# Patient Record
Sex: Male | Born: 1985 | Race: Black or African American | Hispanic: No | Marital: Married | Smoking: Never smoker
Health system: Southern US, Community
[De-identification: ages and names within clinical notes are randomized; demographics above are authoritative.]

## PROBLEM LIST (undated history)

## (undated) DIAGNOSIS — K219 Gastro-esophageal reflux disease without esophagitis: Secondary | ICD-10-CM

## (undated) DIAGNOSIS — C9590 Leukemia, unspecified not having achieved remission: Secondary | ICD-10-CM

## (undated) HISTORY — PX: CHOLECYSTECTOMY: SHX55

## (undated) HISTORY — PX: WISDOM TOOTH EXTRACTION: SHX21

---

## 2014-03-23 ENCOUNTER — Other Ambulatory Visit: Payer: Self-pay | Admitting: Family Medicine

## 2014-03-23 DIAGNOSIS — R52 Pain, unspecified: Secondary | ICD-10-CM

## 2014-03-23 DIAGNOSIS — R609 Edema, unspecified: Secondary | ICD-10-CM

## 2014-03-27 ENCOUNTER — Encounter (INDEPENDENT_AMBULATORY_CARE_PROVIDER_SITE_OTHER): Payer: Self-pay

## 2014-03-27 ENCOUNTER — Ambulatory Visit
Admission: RE | Admit: 2014-03-27 | Discharge: 2014-03-27 | Disposition: A | Discharge status: Home / Self Care | Payer: 59 | Source: Ambulatory Visit | Attending: Family Medicine | Admitting: Family Medicine

## 2014-03-27 DIAGNOSIS — R52 Pain, unspecified: Secondary | ICD-10-CM

## 2014-03-27 DIAGNOSIS — R609 Edema, unspecified: Secondary | ICD-10-CM

## 2017-05-19 ENCOUNTER — Emergency Department (HOSPITAL_COMMUNITY): Payer: Managed Care, Other (non HMO)

## 2017-05-19 ENCOUNTER — Encounter (HOSPITAL_COMMUNITY): Payer: Self-pay | Admitting: *Deleted

## 2017-05-19 ENCOUNTER — Emergency Department (HOSPITAL_COMMUNITY)
Admission: EM | Admit: 2017-05-19 | Discharge: 2017-05-20 | Disposition: A | Discharge status: Home / Self Care | Payer: Managed Care, Other (non HMO) | Attending: Emergency Medicine | Admitting: Emergency Medicine

## 2017-05-19 DIAGNOSIS — R002 Palpitations: Secondary | ICD-10-CM

## 2017-05-19 DIAGNOSIS — I493 Ventricular premature depolarization: Secondary | ICD-10-CM

## 2017-05-19 DIAGNOSIS — R072 Precordial pain: Secondary | ICD-10-CM

## 2017-05-19 DIAGNOSIS — R008 Other abnormalities of heart beat: Secondary | ICD-10-CM | POA: Diagnosis not present

## 2017-05-19 LAB — MAGNESIUM: MAGNESIUM: 2.2 mg/dL (ref 1.7–2.4)

## 2017-05-19 LAB — BASIC METABOLIC PANEL
ANION GAP: 10 (ref 5–15)
BUN: 9 mg/dL (ref 6–20)
CO2: 24 mmol/L (ref 22–32)
Calcium: 9.6 mg/dL (ref 8.9–10.3)
Chloride: 101 mmol/L (ref 101–111)
Creatinine, Ser: 1.2 mg/dL (ref 0.61–1.24)
Glucose, Bld: 90 mg/dL (ref 65–99)
POTASSIUM: 3.4 mmol/L — AB (ref 3.5–5.1)
SODIUM: 135 mmol/L (ref 135–145)

## 2017-05-19 LAB — CBC
HEMATOCRIT: 45.2 % (ref 39.0–52.0)
HEMOGLOBIN: 15.8 g/dL (ref 13.0–17.0)
MCH: 29.1 pg (ref 26.0–34.0)
MCHC: 35 g/dL (ref 30.0–36.0)
MCV: 83.2 fL (ref 78.0–100.0)
Platelets: 329 10*3/uL (ref 150–400)
RBC: 5.43 MIL/uL (ref 4.22–5.81)
RDW: 12.7 % (ref 11.5–15.5)
WBC: 8.1 10*3/uL (ref 4.0–10.5)

## 2017-05-19 LAB — I-STAT TROPONIN, ED
TROPONIN I, POC: 0 ng/mL (ref 0.00–0.08)
Troponin i, poc: 0 ng/mL (ref 0.00–0.08)

## 2017-05-19 MED ORDER — SODIUM CHLORIDE 0.9 % IV BOLUS (SEPSIS)
1000.0000 mL | Freq: Once | INTRAVENOUS | Status: AC
  Administered 2017-05-19: 1000 mL via INTRAVENOUS

## 2017-05-19 NOTE — ED Notes (Signed)
Occasional PVC noted on monitor. Pt denies pain.

## 2017-05-19 NOTE — ED Notes (Signed)
Lab to add on a mag level to blood they have.

## 2017-05-19 NOTE — ED Provider Notes (Signed)
[facility] EMERGENCY DEPARTMENT Provider Note   CSN: [id number] Arrival date & time: 05/19/17  1755     History   Chief Complaint Chief Complaint  Patient presents with  . Chest Pain  . Palpitations    HPI Charles Huffman is a 31 y.o. male.  The history is provided by the patient, the spouse and medical records.  Palpitations   This is a recurrent problem. The current episode started 12 to 24 hours ago. The problem occurs constantly. The problem has not changed since onset.On average, each episode lasts 1 day. Associated symptoms include chest pain and irregular heartbeat. Pertinent negatives include no diaphoresis, no fever, no malaise/fatigue, no numbness, no chest pressure, no exertional chest pressure, no syncope, no abdominal pain, no nausea, no vomiting, no headaches, no back pain, no lower extremity edema, no dizziness, no weakness, no cough, no shortness of breath and no sputum production. He has tried nothing for the symptoms. The treatment provided no relief. There are no known risk factors. His past medical history does not include anemia or heart disease.    History reviewed. No pertinent past medical history.  There are no active problems to display for this patient.   History reviewed. No pertinent surgical history.     Home Medications    Prior to Admission medications   Not on File    Family History History reviewed. No pertinent family history.  Social History Social History  Substance Use Topics  . Smoking status: Never Smoker  . Smokeless tobacco: Not on file  . Alcohol use No     Allergies   Vancomycin   Review of Systems Review of Systems  Constitutional: Negative for chills, diaphoresis, fatigue, fever and malaise/fatigue.  HENT: Negative for congestion and rhinorrhea.   Eyes: Negative for visual disturbance.  Respiratory: Negative for cough, sputum production, chest tightness, shortness of breath, wheezing and  stridor.   Cardiovascular: Positive for chest pain and palpitations. Negative for leg swelling and syncope.  Gastrointestinal: Negative for abdominal pain, constipation, diarrhea, nausea and vomiting.  Genitourinary: Negative for flank pain, frequency and urgency.  Musculoskeletal: Negative for back pain, neck pain and neck stiffness.  Neurological: Positive for light-headedness. Negative for dizziness, seizures, syncope, weakness, numbness and headaches.  Psychiatric/Behavioral: Negative for agitation.  All other systems reviewed and are negative.    Physical Exam Updated Vital Signs BP 138/85 (BP Location: Right Arm)   Pulse 79   Temp 98.6 F (37 C) (Oral)   Resp 14   Ht 5\' 11"  (1.803 m)   Wt 106.6 kg (235 lb)   SpO2 100%   BMI 32.78 kg/m   Physical Exam  Constitutional: He is oriented to person, place, and time. He appears well-developed and well-nourished. No distress.  HENT:  Head: Normocephalic and atraumatic.  Nose: Nose normal.  Mouth/Throat: Oropharynx is clear and moist. No oropharyngeal exudate.  Eyes: Pupils are equal, round, and reactive to light. Conjunctivae are normal.  Neck: Normal range of motion. Neck supple.  Cardiovascular: Normal rate, regular rhythm and intact distal pulses.  Frequent extrasystoles are present.  No murmur heard. Intermittent episodes of trigeminy with PVCs.  Pulmonary/Chest: Effort normal and breath sounds normal. No respiratory distress. He has no wheezes. He exhibits no tenderness.  Abdominal: Soft. There is no tenderness.  Musculoskeletal: He exhibits no edema or tenderness.  Neurological: He is alert and oriented to person, place, and time. He displays normal reflexes. No sensory deficit.  Skin: Skin is warm  and dry. He is not diaphoretic. No erythema. No pallor.  Psychiatric: He has a normal mood and affect.  Nursing note and vitals reviewed.    ED Treatments / Results  Labs (all labs ordered are listed, but only abnormal  results are displayed) Labs Reviewed  BASIC METABOLIC PANEL - Abnormal; Notable for the following:       Result Value   Potassium 3.4 (*)    All other components within normal limits  CBC  MAGNESIUM  I-STAT TROPONIN, ED  I-STAT TROPONIN, ED    EKG  EKG Interpretation  Date/Time:  Saturday May 19 2017 18:04:16 EDT Ventricular Rate:  84 PR Interval:  156 QRS Duration: 82 QT Interval:  346 QTC Calculation: 408 R Axis:   -23 Text Interpretation:  Sinus rhythm with occasional Premature ventricular complexes Low voltage QRS Borderline ECG No prior ECG for comparison. PVC present.  No STEMI Confirmed by Antony Blackbird [phone]) on 05/19/2017 8:32:21 PM       Radiology Dg Chest 2 View  Result Date: 05/19/2017 CLINICAL DATA:  Chest discomfort. EXAM: CHEST  2 VIEW COMPARISON:  None. FINDINGS: The heart size and mediastinal contours are within normal limits. Both lungs are clear. The visualized skeletal structures are unremarkable. IMPRESSION: No active cardiopulmonary disease. Electronically Signed   By: Dorise Bullion III M.D   On: 05/19/2017 19:38    Procedures Procedures (including critical care time)  Medications Ordered in ED Medications  sodium chloride 0.9 % bolus 1,000 mL (0 mLs Intravenous Stopped 05/19/17 2348)     Initial Impression / Assessment and Plan / ED Course  I have reviewed the triage vital signs and the nursing notes.  Pertinent labs & imaging results that were available during my care of the patient were reviewed by me and considered in my medical decision making (see chart for details).     Charles Huffman is a 31 y.o. male with no significant past medical history who presents with palpitations and chest discomfort.  Patient said that he has had these symptoms in the past.  He says that they usually last for approximately 1 day and then go away.  He says that he has had no other preceding symptoms.  He reports some mild chest pressure that does not  radiate.  He says he feels palpitations that correlate to PVCs on telemetry in triage.  Patient denies fevers, chills, shortness of breath, nausea, vomiting, diaphoresis.  He denies alcohol or new caffeine intake.  He denies drugs.  On exam, lungs are clear.  Chest is nontender.  No lower extremity edema.  No focal neurologic deficits.  Abdomen nontender.  Symmetric pulses.  EKG showed PVCs.  No STEMI.  Patient had workup to look for electrolyte abnormality or other causes of chest pain/PVCs.  Magnesium normal.  Troponin negative x2.  Potassium slightly decreased.  Chest x-ray reassuring.  Patient given fluids.  As patient's workup shows no evidence of cardiac injury and he has demonstrated stability with only PVCs seen on EKG, patient was felt stable for discharge home.  Patient was instructed to follow-up with cardiology and will likely need cardiac monitor placed.  Patient encouraged to stay hydrated and maintain normal diet.  Patient and spouse understood return precautions for any new or worsening symptoms.  Patient had no other questions or concerns and was discharged in good condition.     Final Clinical Impressions(s) / ED Diagnoses   Final diagnoses:  Palpitations  PVC's (premature ventricular contractions)  Trigeminy  Precordial pain    New Prescriptions Discharge Medication List as of 05/20/2017 12:10 AM      Clinical Impression: 1. Palpitations   2. PVC's (premature ventricular contractions)   3. Trigeminy   4. Precordial pain     Disposition: Discharge  Condition: Good  I have discussed the results, Dx and Tx plan with the pt(& family if present). He/she/they expressed understanding and agree(s) with the plan. Discharge instructions discussed at great length. Strict return precautions discussed and pt &/or family have verbalized understanding of the instructions. No further questions at time of discharge.    Discharge Medication List as of 05/20/2017 12:10  AM      Follow Up: [place] [id number] [phone] Schedule an appointment as soon as possible for a visit    [facility] [address] [id number] [place] [phone]  If symptoms worsen     Jasmin Trumbull, Gwenyth Allegra, MD 05/20/17 [phone]

## 2017-05-19 NOTE — ED Notes (Signed)
Patient denies pain and is resting comfortably.  

## 2017-05-19 NOTE — ED Notes (Signed)
Pt awaiting cardiologist eval. Denies cp or sob. VSS. Watching TV. Family at bedside.

## 2017-05-19 NOTE — ED Triage Notes (Signed)
Pt reports waking up today with chest discomfort, "feels like something is stuck in his throat." also having palpitations and intermittent pain. ekg done on arrival. No acute distress is noted. Skin w/d. Denies sob or recent swelling.

## 2017-05-20 NOTE — Discharge Instructions (Signed)
Your workup today showed evidence of extra heartbeats called PVCs.  We did not see evidence of heart injury or infection.  Based on your reassuring workup, we feel you are safe for discharge home.  Please follow-up with a cardiologist for further evaluation.  If any symptoms change or worsen, please return to the nearest emergency department.

## 2020-12-15 ENCOUNTER — Ambulatory Visit: Payer: Self-pay | Admitting: Surgery

## 2020-12-15 NOTE — H&P (Signed)
  Charles Huffman DOB: 07/21/1986 Married / Language: English / Race: Black or African American Male   History of Present Illness Patient words: This is a very pleasant and otherwise healthy 35-year-old man with about a 3 year history of an intermittently draining cyst just to the right of the natal cleft.  This does not necessarily cause pain, but spontaneously swells and drains.  Has not required any previous procedures for this.   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     Past Surgical History Gallbladder Surgery - Laparoscopic    Diagnostic Studies History Colonoscopy  never   Allergies Vancomycin HCl *ANTI-INFECTIVE AGENTS - MISC.*    Medication History Crestor  (5MG Tablet, Oral) Active.   Social History Caffeine use  Carbonated beverages. No alcohol use  No drug use  Tobacco use  Never smoker.   Family History Hypertension  Brother, Father. Prostate Cancer  Father.   Other Problems Cancer  Gastroesophageal Reflux Disease  Hypercholesterolemia          Review of Systems General Not Present- Appetite Loss, Chills, Fatigue, Fever, Night Sweats, Weight Gain and Weight Loss. HEENT Present- Wears glasses/contact lenses. Not Present- Earache, Hearing Loss, Hoarseness, Nose Bleed, Oral Ulcers, Ringing in the Ears, Seasonal Allergies, Sinus Pain, Sore Throat, Visual Disturbances and Yellow Eyes. Respiratory Not Present- Bloody sputum, Chronic Cough, Difficulty Breathing, Snoring and Wheezing. Breast Not Present- Breast Mass, Breast Pain, Nipple Discharge and Skin Changes. Cardiovascular Not Present- Chest Pain, Difficulty Breathing Lying Down, Leg Cramps, Palpitations, Rapid Heart Rate, Shortness of Breath and Swelling of Extremities. Gastrointestinal Not Present- Abdominal Pain, Bloating, Bloody Stool, Change in Bowel Habits, Chronic diarrhea, Constipation, Difficulty Swallowing, Excessive gas, Gets full quickly at meals, Hemorrhoids, Indigestion, Nausea, Rectal Pain  and Vomiting. Male Genitourinary Not Present- Blood in Urine, Change in Urinary Stream, Frequency, Impotence, Nocturia, Painful Urination, Urgency and Urine Leakage. Musculoskeletal Not Present- Back Pain, Joint Pain, Joint Stiffness, Muscle Pain, Muscle Weakness and Swelling of Extremities. Neurological Not Present- Decreased Memory, Fainting, Headaches, Numbness, Seizures, Tingling, Tremor, Trouble walking and Weakness. Psychiatric Not Present- Anxiety, Bipolar, Change in Sleep Pattern, Depression, Fearful and Frequent crying. Endocrine Not Present- Cold Intolerance, Excessive Hunger, Hair Changes, Heat Intolerance, Hot flashes and New Diabetes. Hematology Not Present- Blood Thinners, Easy Bruising, Excessive bleeding, Gland problems, HIV and Persistent Infections. All other systems negative   Vitals Weight: 251.5 lb   Height: 72 in  Body Surface Area: 2.35 m   Body Mass Index: 34.11 kg/m   Temp.: 97.7 F    Pulse: 111 (Regular)    P.OX: 94% (Room air) BP: 150/100(Sitting, Left Arm, Standard)     Physical Exam Alert, well-appearing Unlabored respirations At the upper aspect of the natal cleft to the right of midline there is a keloid with some mild underlying induration. No erythema, warmth, tenderness or fluctuance at this time.     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 Discussed options for treatment including ongoing observation, hair removal, excision. He wishes to proceed with the latter. We discussed the procedure and risks of bleeding, infection, pain, scarring, wound healing problems, recurrence of lesion, and ongoing hygiene measures to prevent recurrence postoperatively. Questions were answered. Schedule at his convenience 

## 2021-01-12 NOTE — Patient Instructions (Signed)
DUE TO COVID-19 ONLY ONE VISITOR IS ALLOWED TO COME WITH YOU AND STAY IN THE WAITING ROOM ONLY DURING PRE OP AND PROCEDURE DAY OF SURGERY. THE 1 VISITOR  MAY VISIT WITH YOU AFTER SURGERY IN YOUR PRIVATE ROOM DURING VISITING HOURS ONLY!               Charles Huffman   Your procedure is scheduled on: 01/19/21   Report to [facility] Main  Entrance   Report to admitting at : 10:00 AM     Call this number if you have problems the morning of surgery [phone]    Remember: Do not eat solid food :After Midnight. Clear liquids until: 9:00 am.  CLEAR LIQUID DIET  Foods Allowed                                                                     Foods Excluded  Coffee and tea, regular and decaf                             liquids that you cannot  Plain Jell-O any favor except red or purple                                           see through such as: Fruit ices (not with fruit pulp)                                     milk, soups, orange juice  Iced Popsicles                                    All solid food Carbonated beverages, regular and diet                                    Cranberry, grape and apple juices Sports drinks like Gatorade Lightly seasoned clear broth or consume(fat free) Sugar, honey syrup  Sample Menu Breakfast                                Lunch                                     Supper Cranberry juice                    Beef broth                            Chicken broth Jell-O                                     Grape  juice                           Apple juice Coffee or tea                        Jell-O                                      Popsicle                                                Coffee or tea                        Coffee or tea  _____________________________________________________________________  BRUSH YOUR TEETH MORNING OF SURGERY AND RINSE YOUR MOUTH OUT, NO CHEWING GUM CANDY OR MINTS.                               You may not  have any metal on your body including hair pins and              piercings  Do not wear jewelry,lotions, powders or perfumes, deodorant             Men may shave face and neck.   Do not bring valuables to the hospital. [place].  Contacts, dentures or bridgework may not be worn into surgery.  Leave suitcase in the car. After surgery it may be brought to your room.     Patients discharged the day of surgery will not be allowed to drive home. IF YOU ARE HAVING SURGERY AND GOING HOME THE SAME DAY, YOU MUST HAVE AN ADULT TO DRIVE YOU HOME AND BE WITH YOU FOR 24 HOURS. YOU MAY GO HOME BY TAXI OR UBER OR ORTHERWISE, BUT AN ADULT MUST ACCOMPANY YOU HOME AND STAY WITH YOU FOR 24 HOURS.  Name and phone number of your driver:  Special Instructions: N/A              Please read over the following fact sheets you were given: _____________________________________________________________________           [facility] - Preparing for Surgery Before surgery, you can play an important role.  Because skin is not sterile, your skin needs to be as free of germs as possible.  You can reduce the number of germs on your skin by washing with CHG (chlorahexidine gluconate) soap before surgery.  CHG is an antiseptic cleaner which kills germs and bonds with the skin to continue killing germs even after washing. Please DO NOT use if you have an allergy to CHG or antibacterial soaps.  If your skin becomes reddened/irritated stop using the CHG and inform your nurse when you arrive at Short Stay. Do not shave (including legs and underarms) for at least 48 hours prior to the first CHG shower.  You may shave your face/neck. Please follow these instructions carefully:  1.  Shower with CHG Soap the night before surgery and the  morning of Surgery.  2.  If you choose to wash your hair, wash your hair first as usual with your  normal  shampoo.  3.  After you shampoo, rinse your  hair and body thoroughly to remove the  shampoo.                           4.  Use CHG as you would any other liquid soap.  You can apply chg directly  to the skin and wash                       Gently with a scrungie or clean washcloth.  5.  Apply the CHG Soap to your body ONLY FROM THE NECK DOWN.   Do not use on face/ open                           Wound or open sores. Avoid contact with eyes, ears mouth and genitals (private parts).                       Wash face,  Genitals (private parts) with your normal soap.             6.  Wash thoroughly, paying special attention to the area where your surgery  will be performed.  7.  Thoroughly rinse your body with warm water from the neck down.  8.  DO NOT shower/wash with your normal soap after using and rinsing off  the CHG Soap.                9.  Pat yourself dry with a clean towel.            10.  Wear clean pajamas.            11.  Place clean sheets on your bed the night of your first shower and do not  sleep with pets. Day of Surgery : Do not apply any lotions/deodorants the morning of surgery.  Please wear clean clothes to the hospital/surgery center.  FAILURE TO FOLLOW THESE INSTRUCTIONS MAY RESULT IN THE CANCELLATION OF YOUR SURGERY PATIENT SIGNATURE_________________________________  NURSE SIGNATURE__________________________________  ________________________________________________________________________

## 2021-01-13 ENCOUNTER — Encounter (HOSPITAL_COMMUNITY)
Admission: RE | Admit: 2021-01-13 | Discharge: 2021-01-13 | Disposition: A | Discharge status: Home / Self Care | Payer: Self-pay | Source: Ambulatory Visit | Attending: Surgery | Admitting: Surgery

## 2021-01-13 ENCOUNTER — Encounter (HOSPITAL_COMMUNITY): Payer: Self-pay

## 2021-01-13 ENCOUNTER — Other Ambulatory Visit: Payer: Self-pay

## 2021-01-13 DIAGNOSIS — Z01812 Encounter for preprocedural laboratory examination: Secondary | ICD-10-CM | POA: Insufficient documentation

## 2021-01-13 HISTORY — DX: Leukemia, unspecified not having achieved remission: C95.90

## 2021-01-13 HISTORY — DX: Gastro-esophageal reflux disease without esophagitis: K21.9

## 2021-01-13 LAB — CBC
HCT: 43.3 % (ref 39.0–52.0)
Hemoglobin: 14.5 g/dL (ref 13.0–17.0)
MCH: 27.7 pg (ref 26.0–34.0)
MCHC: 33.5 g/dL (ref 30.0–36.0)
MCV: 82.6 fL (ref 80.0–100.0)
Platelets: 346 10*3/uL (ref 150–400)
RBC: 5.24 MIL/uL (ref 4.22–5.81)
RDW: 13 % (ref 11.5–15.5)
WBC: 8.5 10*3/uL (ref 4.0–10.5)
nRBC: 0 % (ref 0.0–0.2)

## 2021-01-13 NOTE — Progress Notes (Signed)
COVID Vaccine Completed: Yes x3 per pt Date COVID Vaccine completed:  Has received booster: x1 COVID vaccine manufacturer: Moderna    Date of COVID positive in last 90 days: N/A  PCP - Rachell Cipro Cardiologist - N/A  Chest x-ray - N/A EKG - N/A Stress Test - 2013 per pt ECHO - when pt was 9 per pt Cardiac Cath - N/A Pacemaker/ICD device last checked: Spinal Cord Stimulator:  Sleep Study - N/A CPAP -   Fasting Blood Sugar - N/A Checks Blood Sugar _____ times a day  Blood Thinner Instructions: N/A Aspirin Instructions: Last Dose:  Activity level:  Can go up a flight of stairs and perform activities of daily living without stopping and without symptoms of chest pain or shortness of breath. Able to exercise without symptoms.     Anesthesia review:   Patient denies shortness of breath, fever, cough and chest pain at PAT appointment   Patient verbalized understanding of instructions that were given to them at the PAT appointment. Patient was also instructed that they will need to review over the PAT instructions again at home before surgery.

## 2021-01-17 ENCOUNTER — Other Ambulatory Visit (HOSPITAL_COMMUNITY)
Admission: RE | Admit: 2021-01-17 | Discharge: 2021-01-17 | Disposition: A | Discharge status: Home / Self Care | Payer: No Typology Code available for payment source | Source: Ambulatory Visit | Attending: Surgery | Admitting: Surgery

## 2021-01-17 DIAGNOSIS — Z01812 Encounter for preprocedural laboratory examination: Secondary | ICD-10-CM | POA: Insufficient documentation

## 2021-01-17 DIAGNOSIS — Z20822 Contact with and (suspected) exposure to covid-19: Secondary | ICD-10-CM | POA: Insufficient documentation

## 2021-01-17 LAB — SARS CORONAVIRUS 2 (TAT 6-24 HRS): SARS Coronavirus 2: NEGATIVE

## 2021-01-18 NOTE — Anesthesia Preprocedure Evaluation (Addendum)
Anesthesia Evaluation  Patient identified by MRN, date of birth, ID band Patient awake    Reviewed: Allergy & Precautions, NPO status , Patient's Chart, lab work & pertinent test results  Airway Mallampati: II  TM Distance: >3 FB Neck ROM: Full    Dental no notable dental hx. (+) Teeth Intact, Dental Advisory Given   Pulmonary neg pulmonary ROS,    Pulmonary exam normal breath sounds clear to auscultation       Cardiovascular Exercise Tolerance: Good negative cardio ROS Normal cardiovascular exam Rhythm:Regular Rate:Normal     Neuro/Psych negative neurological ROS     GI/Hepatic Neg liver ROS,   Endo/Other  negative endocrine ROS  Renal/GU negative Renal ROS     Musculoskeletal negative musculoskeletal ROS (+)   Abdominal (+) + obese,   Peds  Hematology   Anesthesia Other Findings   Reproductive/Obstetrics                            Anesthesia Physical Anesthesia Plan  ASA: 2  Anesthesia Plan: MAC   Post-op Pain Management:    Induction:   PONV Risk Score and Plan: 2 and Treatment may vary due to age or medical condition, Ondansetron and Midazolam  Airway Management Planned: Natural Airway  Additional Equipment: None  Intra-op Plan:   Post-operative Plan:   Informed Consent:     Dental advisory given  Plan Discussed with: CRNA  Anesthesia Plan Comments: (Prone MAC)        Anesthesia Quick Evaluation

## 2021-01-19 ENCOUNTER — Encounter (HOSPITAL_COMMUNITY): Payer: Self-pay | Admitting: Surgery

## 2021-01-19 ENCOUNTER — Ambulatory Visit (HOSPITAL_COMMUNITY): Payer: PRIVATE HEALTH INSURANCE | Admitting: Certified Registered Nurse Anesthetist

## 2021-01-19 ENCOUNTER — Other Ambulatory Visit (HOSPITAL_COMMUNITY): Payer: Managed Care, Other (non HMO)

## 2021-01-19 ENCOUNTER — Ambulatory Visit (HOSPITAL_COMMUNITY)
Admission: RE | Admit: 2021-01-19 | Discharge: 2021-01-19 | Disposition: A | Discharge status: Home / Self Care | Payer: PRIVATE HEALTH INSURANCE | Source: Ambulatory Visit | Attending: Surgery | Admitting: Surgery

## 2021-01-19 ENCOUNTER — Encounter (HOSPITAL_COMMUNITY)
Admission: RE | Disposition: A | Discharge status: Home / Self Care | Payer: Self-pay | Source: Ambulatory Visit | Attending: Surgery

## 2021-01-19 DIAGNOSIS — Z79899 Other long term (current) drug therapy: Secondary | ICD-10-CM | POA: Diagnosis not present

## 2021-01-19 DIAGNOSIS — E7800 Pure hypercholesterolemia, unspecified: Secondary | ICD-10-CM | POA: Diagnosis not present

## 2021-01-19 DIAGNOSIS — Z881 Allergy status to other antibiotic agents status: Secondary | ICD-10-CM | POA: Insufficient documentation

## 2021-01-19 DIAGNOSIS — K219 Gastro-esophageal reflux disease without esophagitis: Secondary | ICD-10-CM | POA: Insufficient documentation

## 2021-01-19 DIAGNOSIS — L0591 Pilonidal cyst without abscess: Secondary | ICD-10-CM | POA: Insufficient documentation

## 2021-01-19 DIAGNOSIS — Z8249 Family history of ischemic heart disease and other diseases of the circulatory system: Secondary | ICD-10-CM | POA: Insufficient documentation

## 2021-01-19 HISTORY — PX: PILONIDAL CYST EXCISION: SHX744

## 2021-01-19 SURGERY — EXCISION, PILONIDAL CYST, EXTENSIVE
Anesthesia: Monitor Anesthesia Care

## 2021-01-19 MED ORDER — HYDROGEN PEROXIDE 3 % EX SOLN
CUTANEOUS | Status: DC | PRN
  Administered 2021-01-19: 1

## 2021-01-19 MED ORDER — CHLORHEXIDINE GLUCONATE 4 % EX LIQD: 60.0000 mL | Freq: Once | CUTANEOUS | Status: DC

## 2021-01-19 MED ORDER — DEXAMETHASONE SODIUM PHOSPHATE 10 MG/ML IJ SOLN
INTRAMUSCULAR | Status: AC
  Filled 2021-01-19: qty 1

## 2021-01-19 MED ORDER — ONDANSETRON HCL 4 MG/2ML IJ SOLN: 4.0000 mg | Freq: Once | INTRAMUSCULAR | Status: DC | PRN

## 2021-01-19 MED ORDER — CHLORHEXIDINE GLUCONATE 0.12 % MT SOLN
15.0000 mL | Freq: Once | OROMUCOSAL | Status: AC
  Administered 2021-01-19: 15 mL via OROMUCOSAL

## 2021-01-19 MED ORDER — DEXMEDETOMIDINE (PRECEDEX) IN NS 20 MCG/5ML (4 MCG/ML) IV SYRINGE
PREFILLED_SYRINGE | INTRAVENOUS | Status: AC
  Filled 2021-01-19: qty 5

## 2021-01-19 MED ORDER — MIDAZOLAM HCL 5 MG/5ML IJ SOLN
INTRAMUSCULAR | Status: DC | PRN
  Administered 2021-01-19: 2 mg via INTRAVENOUS

## 2021-01-19 MED ORDER — HYDROMORPHONE HCL 1 MG/ML IJ SOLN: 0.2500 mg | INTRAMUSCULAR | Status: DC | PRN

## 2021-01-19 MED ORDER — HYDROGEN PEROXIDE 3 % EX SOLN
CUTANEOUS | Status: AC
  Filled 2021-01-19: qty 473

## 2021-01-19 MED ORDER — PROPOFOL 500 MG/50ML IV EMUL
INTRAVENOUS | Status: DC | PRN
  Administered 2021-01-19: 100 ug/kg/min via INTRAVENOUS

## 2021-01-19 MED ORDER — OXYCODONE HCL 5 MG/5ML PO SOLN: 5.0000 mg | Freq: Once | ORAL | Status: DC | PRN

## 2021-01-19 MED ORDER — ACETAMINOPHEN 10 MG/ML IV SOLN: 1000.0000 mg | Freq: Once | INTRAVENOUS | Status: DC | PRN

## 2021-01-19 MED ORDER — PROPOFOL 500 MG/50ML IV EMUL
INTRAVENOUS | Status: AC
  Filled 2021-01-19: qty 50

## 2021-01-19 MED ORDER — ROCURONIUM BROMIDE 10 MG/ML (PF) SYRINGE
PREFILLED_SYRINGE | INTRAVENOUS | Status: AC
  Filled 2021-01-19: qty 10

## 2021-01-19 MED ORDER — TRAMADOL HCL 50 MG PO TABS: 50.0000 mg | ORAL_TABLET | Freq: Four times a day (QID) | ORAL | 0 refills | Status: AC | PRN

## 2021-01-19 MED ORDER — CEFAZOLIN SODIUM-DEXTROSE 2-4 GM/100ML-% IV SOLN
2.0000 g | INTRAVENOUS | Status: AC
  Administered 2021-01-19: 2 g via INTRAVENOUS
  Filled 2021-01-19: qty 100

## 2021-01-19 MED ORDER — MIDAZOLAM HCL 2 MG/2ML IJ SOLN
INTRAMUSCULAR | Status: AC
  Filled 2021-01-19: qty 2

## 2021-01-19 MED ORDER — ACETAMINOPHEN 500 MG PO TABS
1000.0000 mg | ORAL_TABLET | ORAL | Status: AC
  Administered 2021-01-19: 1000 mg via ORAL
  Filled 2021-01-19: qty 2

## 2021-01-19 MED ORDER — LIDOCAINE-EPINEPHRINE 1 %-1:100000 IJ SOLN
INTRAMUSCULAR | Status: AC
  Filled 2021-01-19: qty 1

## 2021-01-19 MED ORDER — PROPOFOL 1000 MG/100ML IV EMUL
INTRAVENOUS | Status: AC
  Filled 2021-01-19: qty 100

## 2021-01-19 MED ORDER — FENTANYL CITRATE (PF) 100 MCG/2ML IJ SOLN
INTRAMUSCULAR | Status: DC | PRN
  Administered 2021-01-19: 25 ug via INTRAVENOUS
  Administered 2021-01-19: 50 ug via INTRAVENOUS
  Administered 2021-01-19: 25 ug via INTRAVENOUS

## 2021-01-19 MED ORDER — PHENYLEPHRINE 40 MCG/ML (10ML) SYRINGE FOR IV PUSH (FOR BLOOD PRESSURE SUPPORT)
PREFILLED_SYRINGE | INTRAVENOUS | Status: AC
  Filled 2021-01-19: qty 10

## 2021-01-19 MED ORDER — FENTANYL CITRATE (PF) 100 MCG/2ML IJ SOLN
INTRAMUSCULAR | Status: AC
  Filled 2021-01-19: qty 2

## 2021-01-19 MED ORDER — DEXMEDETOMIDINE (PRECEDEX) IN NS 20 MCG/5ML (4 MCG/ML) IV SYRINGE
PREFILLED_SYRINGE | INTRAVENOUS | Status: DC | PRN
  Administered 2021-01-19: 8 ug via INTRAVENOUS
  Administered 2021-01-19: 4 ug via INTRAVENOUS
  Administered 2021-01-19: 8 ug via INTRAVENOUS

## 2021-01-19 MED ORDER — ORAL CARE MOUTH RINSE: 15.0000 mL | Freq: Once | OROMUCOSAL | Status: AC

## 2021-01-19 MED ORDER — OXYCODONE HCL 5 MG PO TABS: 5.0000 mg | ORAL_TABLET | Freq: Once | ORAL | Status: DC | PRN

## 2021-01-19 MED ORDER — EPHEDRINE 5 MG/ML INJ
INTRAVENOUS | Status: AC
  Filled 2021-01-19: qty 10

## 2021-01-19 MED ORDER — AMISULPRIDE (ANTIEMETIC) 5 MG/2ML IV SOLN: 10.0000 mg | Freq: Once | INTRAVENOUS | Status: DC | PRN

## 2021-01-19 MED ORDER — LIDOCAINE-EPINEPHRINE 1 %-1:100000 IJ SOLN
INTRAMUSCULAR | Status: DC | PRN
  Administered 2021-01-19: 17 mL

## 2021-01-19 MED ORDER — LACTATED RINGERS IV SOLN: INTRAVENOUS | Status: DC

## 2021-01-19 MED ORDER — ONDANSETRON HCL 4 MG/2ML IJ SOLN
INTRAMUSCULAR | Status: DC | PRN
  Administered 2021-01-19: 4 mg via INTRAVENOUS

## 2021-01-19 SURGICAL SUPPLY — 33 items
BAG COUNTER SPONGE SURGICOUNT (BAG) IMPLANT
BENZOIN TINCTURE PRP APPL 2/3 (GAUZE/BANDAGES/DRESSINGS) IMPLANT
BLADE SURG 15 STRL LF DISP TIS (BLADE) ×1 IMPLANT
BLADE SURG 15 STRL SS (BLADE) ×1
DECANTER SPIKE VIAL GLASS SM (MISCELLANEOUS) IMPLANT
DRAIN PENROSE 18X1/4 LTX STRL (DRAIN) IMPLANT
DRAPE LAPAROTOMY T 98X78 PEDS (DRAPES) IMPLANT
ELECT COATED BLADE 2.86 ST (ELECTRODE) ×2 IMPLANT
GAUZE PACKING IODOFORM 1/4X15 (PACKING) IMPLANT
GAUZE SPONGE 4X4 12PLY STRL (GAUZE/BANDAGES/DRESSINGS) ×2 IMPLANT
GLOVE SURG ENC MOIS LTX SZ6.5 (GLOVE) ×2 IMPLANT
GLOVE SURG UNDER POLY LF SZ7 (GLOVE) ×2 IMPLANT
GOWN STRL REUS W/ TWL LRG LVL3 (GOWN DISPOSABLE) ×2 IMPLANT
GOWN STRL REUS W/TWL LRG LVL3 (GOWN DISPOSABLE) ×2
KIT BASIN OR (CUSTOM PROCEDURE TRAY) ×2 IMPLANT
KIT TURNOVER KIT A (KITS) ×2 IMPLANT
MARKER SKIN DUAL TIP RULER LAB (MISCELLANEOUS) ×2 IMPLANT
NEEDLE HYPO 25X1 1.5 SAFETY (NEEDLE) ×2 IMPLANT
PACK GENERAL/GYN (CUSTOM PROCEDURE TRAY) ×2 IMPLANT
PENCIL SMOKE EVACUATOR (MISCELLANEOUS) IMPLANT
PUNCH BIOPSY 3 (MISCELLANEOUS) ×2 IMPLANT
SOL PREP POV-IOD 4OZ 10% (MISCELLANEOUS) ×2 IMPLANT
SPONGE T-LAP 18X18 ~~LOC~~+RFID (SPONGE) ×2 IMPLANT
STAPLER VISISTAT 35W (STAPLE) IMPLANT
SUT ETHILON 3 0 FSL (SUTURE) ×2 IMPLANT
SUT VIC AB 2-0 SH 18 (SUTURE) IMPLANT
SUT VIC AB 3-0 SH 18 (SUTURE) ×2 IMPLANT
SUT VIC AB 4-0 PS2 18 (SUTURE) IMPLANT
SYR CONTROL 10ML LL (SYRINGE) ×2 IMPLANT
TAPE PAPER 1X10 WHT MICROPORE (GAUZE/BANDAGES/DRESSINGS) ×2 IMPLANT
TOWEL OR 17X26 10 PK STRL BLUE (TOWEL DISPOSABLE) ×2 IMPLANT
TOWEL OR NON WOVEN STRL DISP B (DISPOSABLE) ×2 IMPLANT
YANKAUER SUCT BULB TIP 10FT TU (MISCELLANEOUS) ×2 IMPLANT

## 2021-01-19 NOTE — H&P (Signed)
  Charles Huffman DOB: 11-Sep-1985 Married / Language: English / Race: Black or African American Male   History of Present Illness Patient words: This is a very pleasant and otherwise healthy 35 year old man with about a 3 year history of an intermittently draining cyst just to the right of the natal cleft.  This does not necessarily cause pain, but spontaneously swells and drains.  Has not required any previous procedures for this.   He is a full-time dad to his 47-year-old.     Past Surgical History Gallbladder Surgery - Laparoscopic    Diagnostic Studies History Colonoscopy  never   Allergies Vancomycin HCl *ANTI-INFECTIVE AGENTS - MISC.*    Medication History Crestor  (5MG  Tablet, Oral) Active.   Social History Caffeine use  Carbonated beverages. No alcohol use  No drug use  Tobacco use  Never smoker.   Family History Hypertension  Brother, Father. Prostate Cancer  Father.   Other Problems Cancer  Gastroesophageal Reflux Disease  Hypercholesterolemia          Review of Systems General Not Present- Appetite Loss, Chills, Fatigue, Fever, Night Sweats, Weight Gain and Weight Loss. HEENT Present- Wears glasses/contact lenses. Not Present- Earache, Hearing Loss, Hoarseness, Nose Bleed, Oral Ulcers, Ringing in the Ears, Seasonal Allergies, Sinus Pain, Sore Throat, Visual Disturbances and Yellow Eyes. Respiratory Not Present- Bloody sputum, Chronic Cough, Difficulty Breathing, Snoring and Wheezing. Breast Not Present- Breast Mass, Breast Pain, Nipple Discharge and Skin Changes. Cardiovascular Not Present- Chest Pain, Difficulty Breathing Lying Down, Leg Cramps, Palpitations, Rapid Heart Rate, Shortness of Breath and Swelling of Extremities. Gastrointestinal Not Present- Abdominal Pain, Bloating, Bloody Stool, Change in Bowel Habits, Chronic diarrhea, Constipation, Difficulty Swallowing, Excessive gas, Gets full quickly at meals, Hemorrhoids, Indigestion, Nausea, Rectal Pain  and Vomiting. Male Genitourinary Not Present- Blood in Urine, Change in Urinary Stream, Frequency, Impotence, Nocturia, Painful Urination, Urgency and Urine Leakage. Musculoskeletal Not Present- Back Pain, Joint Pain, Joint Stiffness, Muscle Pain, Muscle Weakness and Swelling of Extremities. Neurological Not Present- Decreased Memory, Fainting, Headaches, Numbness, Seizures, Tingling, Tremor, Trouble walking and Weakness. Psychiatric Not Present- Anxiety, Bipolar, Change in Sleep Pattern, Depression, Fearful and Frequent crying. Endocrine Not Present- Cold Intolerance, Excessive Hunger, Hair Changes, Heat Intolerance, Hot flashes and New Diabetes. Hematology Not Present- Blood Thinners, Easy Bruising, Excessive bleeding, Gland problems, HIV and Persistent Infections. All other systems negative   Vitals Weight: 251.5 lb   Height: 72 in  Body Surface Area: 2.35 m   Body Mass Index: 34.11 kg/m   Temp.: 97.7 F    Pulse: 111 (Regular)    P.OX: 94% (Room air) BP: 150/100(Sitting, Left Arm, Standard)     Physical Exam Alert, well-appearing Unlabored respirations At the upper aspect of the natal cleft to the right of midline there is a keloid with some mild underlying induration. No erythema, warmth, tenderness or fluctuance at this time.     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 Discussed options for treatment including ongoing observation, hair removal, excision. He wishes to proceed with the latter. We discussed the procedure and risks of bleeding, infection, pain, scarring, wound healing problems, recurrence of lesion, and ongoing hygiene measures to prevent recurrence postoperatively. Questions were answered. Schedule at his convenience

## 2021-01-19 NOTE — Op Note (Signed)
Operative Note  Charles Huffman  [id number]  [id number]  01/19/2021   Surgeon: Clovis Riley MD   Assistant: Carlena Hurl PA-C   Procedure performed: excision of pilonidal cyst with trephination of pilonidal sinus   Preop diagnosis: pilonidal cyst Post-op diagnosis/intraop findings: same, sinus tract extending inferomedially to a midline pit   Specimens: pilonidal cyst Retained items: no  EBL: minimal cc Complications: none   Description of procedure: After obtaining informed consent the patient was taken to the operating room and placed prone on operating room table where monitored anesthesia care was initiated, preoperative antibiotics were administered, SCDs applied, and a formal timeout was performed.  The buttocks were gently taped apart and the area of the pilonidal cyst and natal cleft were prepped and draped in usual sterile fashion.  After infiltration with 1% lidocaine with epinephrine, an elliptical incision was made around the chronic scarred area at the upper aspect of the right side of the natal cleft.  Cautery dissection then ensued excising all scar and abnormal granulation tissue.  The cyst and sinus tract extended inferior medially towards a singular midline pit.  Once all removable scar and granulation had been excised, a 3 mm punch was used to trephinate the midline pit and the tract was curetted with a laparotomy pad and then irrigated with hydrogen peroxide.  A single inspissated hair was found.  Once all granulation tissue and debris had been removed and hemostasis ensured, the excision site was closed with 3-0 Vicryl in the deeper tissues, interrupted deep dermal 3-0 Vicryl's, and interrupted vertical mattress 3 oh nylons x3.  The trephination site in the midline cleft was left open.  The incision and small wound were covered with a dry gauze dressing and tape.  The patient was then awakened, extubated and taken to PACU in stable condition.    All counts were correct at  the completion of the case.

## 2021-01-19 NOTE — Transfer of Care (Signed)
Immediate Anesthesia Transfer of Care Note  Patient: Charles Huffman  Procedure(s) Performed: CYST EXCISION PILONIDAL EXTENSIVE  Patient Location: PACU  Anesthesia Type:MAC  Level of Consciousness: drowsy and patient cooperative  Airway & Oxygen Therapy: Patient Spontanous Breathing and Patient connected to nasal cannula oxygen  Post-op Assessment: Report given to RN and Post -op Vital signs reviewed and stable  Post vital signs: Reviewed and stable  Last Vitals:  Vitals Value Taken Time  BP 143/60 01/19/21 1446  Temp    Pulse 91 01/19/21 1453  Resp 23 01/19/21 1452  SpO2 98 % 01/19/21 1453  Vitals shown include unvalidated device data.  Last Pain:  Vitals:   01/19/21 1159  TempSrc:   PainSc: 0-No pain         Complications: No notable events documented.

## 2021-01-19 NOTE — Discharge Instructions (Addendum)
  Postoperative Instructions Surgery for Pilonidal Disease Restrictions  You may shower after 24 hours but you should avoid soaking in water for more than ten minutes.  There are no dietary restrictions.  You should avoid alcohol while you are on narcotic pain medication.  Activity can be as tolerated.  Wound care - Keep the area clean and dry. Keep a dry gauze dressing or bandaid over the incision if needed to protect clothing from drainage. Sutures will be removed at your follow up visit in 10-14 days. - Hair removal using clippers, waxing or depilatory creams around the area will help reduce the chance of recurrence. This can be initiated once the wound starts to heal.    Medications  You will be given a prescription for pain medicine when you are discharged from the hospital. In addition to the prescription pain medicine that you were given, you may take Motrin, Advil, or ibuprofen at the same time. This often gives better pain relief than either one by itself.  Stop the prescription and switch to Motrin, Advil, or ibuprofen as soon as these medications can control your pain. (This will reduce your risk of constipation.) -Most patients will experience some swelling and bruising around the incisions.  Ice packs or heating pads (30-60 minutes up to 6 times a day) will help. Use ice for the first few days to help decrease swelling and bruising, then switch to heat to help relax tight/sore spots and speed recovery.  Some people prefer to use ice alone, heat alone, alternating between ice & heat.  Experiment to what works for you.  Swelling and bruising can take several weeks to resolve.  Take Colace 100 mg two times daily until you are seen in the office for your followup visit.  Call the office if:  The pain worsens and you need to increase the amount of pain medication.  You experience persistent fever or chills. (You may have lowgrade fevers on and off for the days following surgery?  this is your body's normal reaction to surgery.)  There is redness of more than  inch around the incisions.  There is drainage of cloudy fluid or pus (Drainage of a yellowish bloody fluid is normal.)  You experience persistent nausea or vomiting.   Please call the office [phone]) to make a followup appointment for one to two weeks after surgery and if you have any other problems, questions, or concerns.   The clinic staff is available to answer your questions during regular business hours (8:30am-5pm).  Please don't hesitate to call and ask to speak to one of our nurses for clinical concerns.              If you have disability or family leave forms, bring them to the office for processing. Do not give them to your doctor.  If you have a medical emergency, go to the nearest emergency room or call 911.  A surgeon from [facility] is always on call at the [facility], [place], [place], [place],   [address] ? MAIN: [phone] ? TOLL FREE: [phone] ?  FAX [phone] [email or web address]

## 2021-01-19 NOTE — Anesthesia Procedure Notes (Signed)
Procedure Name: MAC Date/Time: 01/19/2021 1:56 PM Performed by: West Pugh, CRNA Pre-anesthesia Checklist: Patient identified, Emergency Drugs available, Suction available, Patient being monitored and Timeout performed Patient Re-evaluated:Patient Re-evaluated prior to induction Oxygen Delivery Method: Nasal cannula Preoxygenation: Pre-oxygenation with 100% oxygen Placement Confirmation: positive ETCO2 Dental Injury: Teeth and Oropharynx as per pre-operative assessment

## 2021-01-20 ENCOUNTER — Encounter (HOSPITAL_COMMUNITY): Payer: Self-pay | Admitting: Surgery

## 2021-01-20 LAB — SURGICAL PATHOLOGY

## 2021-01-20 NOTE — Anesthesia Postprocedure Evaluation (Signed)
Anesthesia Post Note  Patient: Charles Huffman  Procedure(s) Performed: CYST EXCISION PILONIDAL EXTENSIVE     Patient location during evaluation: PACU Anesthesia Type: MAC Level of consciousness: awake and alert Pain management: pain level controlled Vital Signs Assessment: post-procedure vital signs reviewed and stable Respiratory status: spontaneous breathing, nonlabored ventilation and respiratory function stable Cardiovascular status: stable and blood pressure returned to baseline Anesthetic complications: no   No notable events documented.  Last Vitals:  Vitals:   01/19/21 1525 01/19/21 1530  BP: 134/75 (!) 146/90  Pulse: 78 82  Resp: 13   Temp:    SpO2: 99% 100%    Last Pain:  Vitals:   01/19/21 1530  TempSrc:   PainSc: 0-No pain   Pain Goal:                   Audry Pili

## 2021-10-11 ENCOUNTER — Other Ambulatory Visit: Payer: Self-pay

## 2021-10-11 ENCOUNTER — Ambulatory Visit
Admission: EM | Admit: 2021-10-11 | Discharge: 2021-10-11 | Disposition: A | Discharge status: Home / Self Care | Payer: 59 | Attending: Physician Assistant | Admitting: Physician Assistant

## 2021-10-11 DIAGNOSIS — J029 Acute pharyngitis, unspecified: Secondary | ICD-10-CM | POA: Diagnosis not present

## 2021-10-11 LAB — POCT RAPID STREP A (OFFICE): Rapid Strep A Screen: NEGATIVE

## 2021-10-11 MED ORDER — AMOXICILLIN-POT CLAVULANATE 875-125 MG PO TABS: 1.0000 | ORAL_TABLET | Freq: Two times a day (BID) | ORAL | 0 refills | Status: DC

## 2021-10-11 NOTE — ED Triage Notes (Signed)
2.5-3 wks of congestion and sore throat with dysphagia. Pt reports he tested negative for strep, but was tx with amoxicillin due to his son testing positive. Pt reports the meds did not decrease his sxs. ?No v/d. Has also been taking ibuprofen. ?Son was dx w/strep again today.  ?

## 2021-10-11 NOTE — ED Provider Notes (Signed)
?[facility] ? ? ? ?CSN: [id number] ?Arrival date & time: 10/11/21  1743 ? ? ?  ? ?History   ?Chief Complaint ?Chief Complaint  ?Patient presents with  ? Sore Throat  ? ? ?HPI ?Charles Huffman is a 36 y.o. male.  ? ?Patient here today for evaluation of congestion he has had the last 2 and half to 3 weeks.  He states he has had some sore throat and dysphagia as well.  He states he has tested negative for strep recently but was given amoxicillin due to exposure through his son.  He states the meds did not help with his symptoms.  He has not had any nausea, vomiting or diarrhea.  He has been taking ibuprofen with some relief.  Patient reports that son tested positive for strep again today.  Wife also tested positive for strep today. ? ?The history is provided by the patient.  ?Sore Throat ?Pertinent negatives include no abdominal pain and no shortness of breath.  ? ?Past Medical History:  ?Diagnosis Date  ? GERD (gastroesophageal reflux disease)   ? Leukemia ([place])   ? ? ?There are no problems to display for this patient. ? ? ?Past Surgical History:  ?Procedure Laterality Date  ? CHOLECYSTECTOMY    ? PILONIDAL CYST EXCISION N/A 01/19/2021  ? Procedure: CYST EXCISION PILONIDAL EXTENSIVE;  Surgeon: Clovis Riley, MD;  Location: [facility];  Service: General;  Laterality: N/A;  ? WISDOM TOOTH EXTRACTION    ? ? ? ? ? ?Home Medications   ? ?Prior to Admission medications   ?Medication Sig Start Date End Date Taking? Authorizing Provider  ?amoxicillin-clavulanate (AUGMENTIN) 875-125 MG tablet Take 1 tablet by mouth every 12 (twelve) hours. 10/11/21  Yes Francene Finders, PA-C  ?rosuvastatin (CRESTOR) 5 MG tablet Take 5 mg by mouth every evening.    [provider]  ?traMADol (ULTRAM) 50 MG tablet Take 1 tablet (50 mg total) by mouth every 6 (six) hours as needed. 01/19/21 01/19/22  Clovis Riley, MD  ? ? ?Family History ?Family History  ?Problem Relation Age of Onset  ? Healthy Mother   ? Hypertension  Father   ? Cancer Father   ? ? ?Social History ?Social History  ? ?Tobacco Use  ? Smoking status: Never  ? Smokeless tobacco: Never  ?Substance Use Topics  ? Alcohol use: No  ? Drug use: No  ? ? ? ?Allergies   ?Vancomycin ? ? ?Review of Systems ?Review of Systems  ?Constitutional:  Positive for chills and fever.  ?HENT:  Positive for congestion and sore throat. Negative for ear pain.   ?Eyes:  Negative for discharge and redness.  ?Respiratory:  Positive for cough. Negative for shortness of breath.   ?Gastrointestinal:  Negative for abdominal pain, diarrhea, nausea and vomiting.  ? ? ?Physical Exam ?Triage Vital Signs ?ED Triage Vitals  ?Enc Vitals Group  ?   BP 10/11/21 1816 (!) 139/91  ?   Pulse Rate 10/11/21 1816 99  ?   Resp 10/11/21 1816 18  ?   Temp 10/11/21 1816 98.3 ?F (36.8 ?C)  ?   Temp Source 10/11/21 1816 Oral  ?   SpO2 10/11/21 1816 96 %  ?   Weight --   ?   Height --   ?   Head Circumference --   ?   Peak Flow --   ?   Pain Score 10/11/21 1820 6  ?   Pain Loc --   ?  Pain Edu? --   ?   Excl. in [place]? --   ? ?No data found. ? ?Updated Vital Signs ?BP (!) 139/91 (BP Location: Right Arm)   Pulse 99   Temp 98.3 ?F (36.8 ?C) (Oral)   Resp 18   SpO2 96%  ?   ? ?Physical Exam ?Vitals and nursing note reviewed.  ?Constitutional:   ?   General: He is not in acute distress. ?   Appearance: Normal appearance. He is not ill-appearing.  ?HENT:  ?   Head: Normocephalic and atraumatic.  ?   Nose: Congestion present.  ?   Mouth/Throat:  ?   Mouth: Mucous membranes are moist.  ?   Pharynx: Oropharynx is clear. Posterior oropharyngeal erythema present. No oropharyngeal exudate.  ?Eyes:  ?   Conjunctiva/sclera: Conjunctivae normal.  ?Cardiovascular:  ?   Rate and Rhythm: Normal rate.  ?Pulmonary:  ?   Effort: Pulmonary effort is normal. No respiratory distress.  ?Skin: ?   General: Skin is warm and dry.  ?Neurological:  ?   Mental Status: He is alert.  ?Psychiatric:     ?   Mood and Affect: Mood normal.     ?   Thought  Content: Thought content normal.  ? ? ? ?UC Treatments / Results  ?Labs ?(all labs ordered are listed, but only abnormal results are displayed) ?Labs Reviewed  ?POCT RAPID STREP A (OFFICE)  ? ? ?EKG ? ? ?Radiology ?No results found. ? ?Procedures ?Procedures (including critical care time) ? ?Medications Ordered in UC ?Medications - No data to display ? ?Initial Impression / Assessment and Plan / UC Course  ?I have reviewed the triage vital signs and the nursing notes. ? ?Pertinent labs & imaging results that were available during my care of the patient were reviewed by me and considered in my medical decision making (see chart for details). ? ?Strep test negative, however given known exposure as well as duration of symptoms will treat with antibiotics to cover both possible strep versus sinusitis.  Augmentin prescribed.  Encouraged follow-up if symptoms fail to improve or worsen. ? ?Final Clinical Impressions(s) / UC Diagnoses  ? ?Final diagnoses:  ?Acute pharyngitis, unspecified etiology  ? ?Discharge Instructions   ?None ?  ? ?ED Prescriptions   ? ? Medication Sig Dispense Auth. Provider  ? amoxicillin-clavulanate (AUGMENTIN) 875-125 MG tablet Take 1 tablet by mouth every 12 (twelve) hours. 14 tablet Francene Finders, PA-C  ? ?  ? ?PDMP not reviewed this encounter. ?  ?Francene Finders, PA-C ?10/11/21 1919 ? ?

## 2021-10-31 ENCOUNTER — Other Ambulatory Visit: Payer: Self-pay

## 2021-10-31 ENCOUNTER — Ambulatory Visit (INDEPENDENT_AMBULATORY_CARE_PROVIDER_SITE_OTHER): Payer: 59

## 2021-10-31 ENCOUNTER — Encounter: Payer: Self-pay | Admitting: Emergency Medicine

## 2021-10-31 ENCOUNTER — Ambulatory Visit
Admission: EM | Admit: 2021-10-31 | Discharge: 2021-10-31 | Disposition: A | Discharge status: Home / Self Care | Payer: 59 | Attending: Internal Medicine | Admitting: Internal Medicine

## 2021-10-31 DIAGNOSIS — R059 Cough, unspecified: Secondary | ICD-10-CM | POA: Diagnosis not present

## 2021-10-31 DIAGNOSIS — R509 Fever, unspecified: Secondary | ICD-10-CM

## 2021-10-31 DIAGNOSIS — J069 Acute upper respiratory infection, unspecified: Secondary | ICD-10-CM | POA: Diagnosis not present

## 2021-10-31 MED ORDER — BENZONATATE 100 MG PO CAPS: 100.0000 mg | ORAL_CAPSULE | Freq: Three times a day (TID) | ORAL | 0 refills | Status: DC | PRN

## 2021-10-31 MED ORDER — FLUTICASONE PROPIONATE 50 MCG/ACT NA SUSP: 1.0000 | Freq: Every day | NASAL | 0 refills | Status: DC

## 2021-10-31 NOTE — ED Triage Notes (Signed)
Pt said x 1 week has been having cough and congestion with body aches and chills when he had a low grade fever. Pt said taking tylenol, for aches and pains.  ?

## 2021-10-31 NOTE — Discharge Instructions (Signed)
Your chest x-ray was normal.  It appears that you have a viral upper respiratory infection that should run its course and self resolve in the next few days.  You have been prescribed 2 medications to help alleviate symptoms. ?

## 2021-10-31 NOTE — ED Provider Notes (Signed)
?[place] ? ? ? ?CSN: [id number] ?Arrival date & time: 10/31/21  1019 ? ? ?  ? ?History   ?Chief Complaint ?Chief Complaint  ?Patient presents with  ? Cough  ? Nasal Congestion  ? ? ?HPI ?Charles Huffman is a 36 y.o. male.  ? ?Patient presents with approximately 1 week history of cough, nasal congestion.  He reports that he developed body aches, chills, fever last night.  Tmax was 100.  Patient has taken Tylenol for symptoms with minimal improvement.  Denies any known sick contacts.  He did present with exposure to strep throat a few weeks prior but reports that those symptoms are resolved and these are new symptoms.  Denies chest pain, shortness of breath, ear pain, nausea, vomiting, diarrhea, abdominal pain. ? ? ?Cough ? ?Past Medical History:  ?Diagnosis Date  ? GERD (gastroesophageal reflux disease)   ? Leukemia ([place])   ? ? ?There are no problems to display for this patient. ? ? ?Past Surgical History:  ?Procedure Laterality Date  ? CHOLECYSTECTOMY    ? PILONIDAL CYST EXCISION N/A 01/19/2021  ? Procedure: CYST EXCISION PILONIDAL EXTENSIVE;  Surgeon: Clovis Riley, MD;  Location: [facility];  Service: General;  Laterality: N/A;  ? WISDOM TOOTH EXTRACTION    ? ? ? ? ? ?Home Medications   ? ?Prior to Admission medications   ?Medication Sig Start Date End Date Taking? Authorizing Provider  ?benzonatate (TESSALON) 100 MG capsule Take 1 capsule (100 mg total) by mouth every 8 (eight) hours as needed for cough. 10/31/21  Yes Teodora Medici, FNP  ?fluticasone (FLONASE) 50 MCG/ACT nasal spray Place 1 spray into both nostrils daily for 3 days. 10/31/21 11/03/21 Yes Teodora Medici, FNP  ?amoxicillin-clavulanate (AUGMENTIN) 875-125 MG tablet Take 1 tablet by mouth every 12 (twelve) hours. 10/11/21   Francene Finders, PA-C  ?rosuvastatin (CRESTOR) 5 MG tablet Take 5 mg by mouth every evening.    [provider]  ?traMADol (ULTRAM) 50 MG tablet Take 1 tablet (50 mg total) by mouth every 6 (six) hours as needed.  01/19/21 01/19/22  Clovis Riley, MD  ? ? ?Family History ?Family History  ?Problem Relation Age of Onset  ? Healthy Mother   ? Hypertension Father   ? Cancer Father   ? ? ?Social History ?Social History  ? ?Tobacco Use  ? Smoking status: Never  ? Smokeless tobacco: Never  ?Substance Use Topics  ? Alcohol use: No  ? Drug use: No  ? ? ? ?Allergies   ?Vancomycin ? ? ?Review of Systems ?Review of Systems ?Per HPI ? ?Physical Exam ?Triage Vital Signs ?ED Triage Vitals  ?Enc Vitals Group  ?   BP 10/31/21 1143 (!) 141/81  ?   Pulse Rate 10/31/21 1143 84  ?   Resp 10/31/21 1143 16  ?   Temp 10/31/21 1143 98.2 ?F (36.8 ?C)  ?   Temp Source 10/31/21 1143 Oral  ?   SpO2 10/31/21 1143 95 %  ?   Weight --   ?   Height --   ?   Head Circumference --   ?   Peak Flow --   ?   Pain Score 10/31/21 1144 1  ?   Pain Loc --   ?   Pain Edu? --   ?   Excl. in [place]? --   ? ?No data found. ? ?Updated Vital Signs ?BP (!) 141/81 (BP Location: Left Arm)   Pulse 84  Temp 98.2 ?F (36.8 ?C) (Oral)   Resp 16   SpO2 95%  ? ?Visual Acuity ?Right Eye Distance:   ?Left Eye Distance:   ?Bilateral Distance:   ? ?Right Eye Near:   ?Left Eye Near:    ?Bilateral Near:    ? ?Physical Exam ?Constitutional:   ?   General: He is not in acute distress. ?   Appearance: Normal appearance. He is not toxic-appearing or diaphoretic.  ?HENT:  ?   Head: Normocephalic and atraumatic.  ?   Right Ear: Tympanic membrane and ear canal normal.  ?   Left Ear: Tympanic membrane and ear canal normal.  ?   Nose: Congestion present.  ?   Mouth/Throat:  ?   Mouth: Mucous membranes are moist.  ?   Pharynx: No posterior oropharyngeal erythema.  ?Eyes:  ?   Extraocular Movements: Extraocular movements intact.  ?   Conjunctiva/sclera: Conjunctivae normal.  ?   Pupils: Pupils are equal, round, and reactive to light.  ?Cardiovascular:  ?   Rate and Rhythm: Normal rate and regular rhythm.  ?   Pulses: Normal pulses.  ?   Heart sounds: Normal heart sounds.  ?Pulmonary:  ?   Effort:  Pulmonary effort is normal. No respiratory distress.  ?   Breath sounds: Normal breath sounds. No stridor. No wheezing, rhonchi or rales.  ?Abdominal:  ?   General: Abdomen is flat. Bowel sounds are normal.  ?   Palpations: Abdomen is soft.  ?Musculoskeletal:     ?   General: Normal range of motion.  ?   Cervical back: Normal range of motion.  ?Skin: ?   General: Skin is warm and dry.  ?Neurological:  ?   General: No focal deficit present.  ?   Mental Status: He is alert and oriented to person, place, and time. Mental status is at baseline.  ?Psychiatric:     ?   Mood and Affect: Mood normal.     ?   Behavior: Behavior normal.  ? ? ? ?UC Treatments / Results  ?Labs ?(all labs ordered are listed, but only abnormal results are displayed) ?Labs Reviewed - No data to display ? ?EKG ? ? ?Radiology ?DG Chest 2 View ? ?Result Date: 10/31/2021 ?CLINICAL DATA:  Cough, fever EXAM: CHEST - 2 VIEW COMPARISON:  05/19/2017 FINDINGS: The heart size and mediastinal contours are within normal limits. Both lungs are clear. The visualized skeletal structures are unremarkable. IMPRESSION: No active cardiopulmonary disease. Electronically Signed   By: Elmer Picker M.D.   On: 10/31/2021 12:30   ? ?Procedures ?Procedures (including critical care time) ? ?Medications Ordered in UC ?Medications - No data to display ? ?Initial Impression / Assessment and Plan / UC Course  ?I have reviewed the triage vital signs and the nursing notes. ? ?Pertinent labs & imaging results that were available during my care of the patient were reviewed by me and considered in my medical decision making (see chart for details). ? ?  ? ?Patient presents with symptoms likely from a viral upper respiratory infection. Differential includes bacterial pneumonia, sinusitis, allergic rhinitis, covid 1 COVID 9, flu. Do not suspect underlying cardiopulmonary process. Symptoms seem unlikely related to ACS, CHF or COPD exacerbations, pneumonia, pneumothorax. Patient is  nontoxic appearing and not in need of emergent medical intervention. ? ?Chest x-ray completed due to new fever with associated cough.  It was unremarkable. ? ?Recommended symptom control with over the counter medications: Daily oral anti-histamine, Oral decongestant or  IN corticosteroid, saline irrigations, cepacol lozenges, Robitussin, Delsym, honey tea.  Sent prescriptions. ? ?Return if symptoms fail to improve in 1-2 weeks or you develop shortness of breath, chest pain, severe headache. Patient states understanding and is agreeable. ? ?Discharged with PCP followup.  ?Final Clinical Impressions(s) / UC Diagnoses  ? ?Final diagnoses:  ?Viral upper respiratory tract infection with cough  ? ? ? ?Discharge Instructions   ? ?  ?Your chest x-ray was normal.  It appears that you have a viral upper respiratory infection that should run its course and self resolve in the next few days.  You have been prescribed 2 medications to help alleviate symptoms. ? ? ? ?ED Prescriptions   ? ? Medication Sig Dispense Auth. Provider  ? fluticasone (FLONASE) 50 MCG/ACT nasal spray Place 1 spray into both nostrils daily for 3 days. 16 g Oswaldo Conroy E, [place]  ? benzonatate (TESSALON) 100 MG capsule Take 1 capsule (100 mg total) by mouth every 8 (eight) hours as needed for cough. 21 capsule Oswaldo Conroy E, [place]  ? ?  ? ?PDMP not reviewed this encounter. ?  ?Teodora Medici, [place] ?10/31/21 1241 ? ?

## 2022-01-13 DIAGNOSIS — L0591 Pilonidal cyst without abscess: Secondary | ICD-10-CM | POA: Diagnosis not present

## 2022-03-31 ENCOUNTER — Ambulatory Visit: Payer: Self-pay | Admitting: Surgery

## 2022-03-31 DIAGNOSIS — L0591 Pilonidal cyst without abscess: Secondary | ICD-10-CM | POA: Diagnosis not present

## 2022-03-31 NOTE — H&P (Signed)
    PROVIDER:  Bobbe Medico, MD  MRN: [id number] DOB: 13-Sep-1985 DATE OF ENCOUNTER: 03/31/2022 Subjective   Chief Complaint: Pilonidal Cyst     History of Present Illness: Charles Huffman is a 36 y.o. male who nderwent excision of pilonidal cyst/ sinus tract with trephination of midline pit on 01/19/21.  The primary pilonidal cyst area was closed primarily.  He recovered fairly well from this but did have a fair amount of drainage from the wound and some hypertrophic scar in this area noted on his postop check. Unfortunately he has had recurrent drainage from the area and presents today with concerns of a recurrent pilonidal cyst.  Review of Systems: A complete review of systems was obtained from the patient.  I have reviewed this information and discussed as appropriate with the patient.  See HPI as well for other ROS.    Medical History: Past Medical History:  Diagnosis Date   GERD (gastroesophageal reflux disease)    Leukemia (CMS-HCC)     Patient Active Problem List  Diagnosis   H/O pilonidal cyst    Past Surgical History:  Procedure Laterality Date   CHOLECYSTECTOMY     CHOLECYSTECTOMY     PILONIDAL CYST / SINUS EXCISION     wisdom tooth extraction       Allergies  Allergen Reactions   Vancomycin Analogues Vancomycin Infusion Reaction    Red man Syndrome    Current Outpatient Medications on File Prior to Visit  Medication Sig Dispense Refill   rosuvastatin (CRESTOR) 5 MG tablet Take 5 mg by mouth nightly     No current facility-administered medications on file prior to visit.    Family History  Problem Relation Age of Onset   Hyperlipidemia (Elevated cholesterol) Sister    High blood pressure (Hypertension) Sister    High blood pressure (Hypertension) Brother    Hyperlipidemia (Elevated cholesterol) Brother      Social History   Tobacco Use  Smoking Status Never  Smokeless Tobacco Never     Social History   Socioeconomic History    Marital status: Married   Number of children: 1  Tobacco Use   Smoking status: Never   Smokeless tobacco: Never  Substance and Sexual Activity   Alcohol use: Never   Drug use: Never    Objective:    There were no vitals filed for this visit.  There is no height or weight on file to calculate BMI.  Physical Exam   Alert, well appearing Approximately 1 cm hypertrophic scar with overlying eschar to the right of midline just above the natal cleft.  Pressure applied to this area, purulent drainage emanates from a nearly invisible midline pit about 1 to 1.5 cm inferior to this.   Assessment and Plan:  Diagnoses and all orders for this visit:  Chronic recurrent pilonidal cyst    Unfortunately appears to have recurred.  We again discussed trephination, and discussed that at this time we will leave the primary cyst wound open to heal by secondary intention.  Questions welcomed and answered.  He wishes to proceed.  Cameshia Cressman Raquel James, MD

## 2022-06-09 DIAGNOSIS — E782 Mixed hyperlipidemia: Secondary | ICD-10-CM | POA: Diagnosis not present

## 2023-03-13 DIAGNOSIS — I499 Cardiac arrhythmia, unspecified: Secondary | ICD-10-CM | POA: Diagnosis not present

## 2023-06-10 DIAGNOSIS — H60321 Hemorrhagic otitis externa, right ear: Secondary | ICD-10-CM | POA: Diagnosis not present

## 2023-07-11 IMAGING — DX DG CHEST 2V
2 series · 2 of 2 positions shown · non-contrast
Comparison: 05/19/2017

CLINICAL DATA: Cough, fever

EXAM:
CHEST - 2 VIEW

[chest pa]
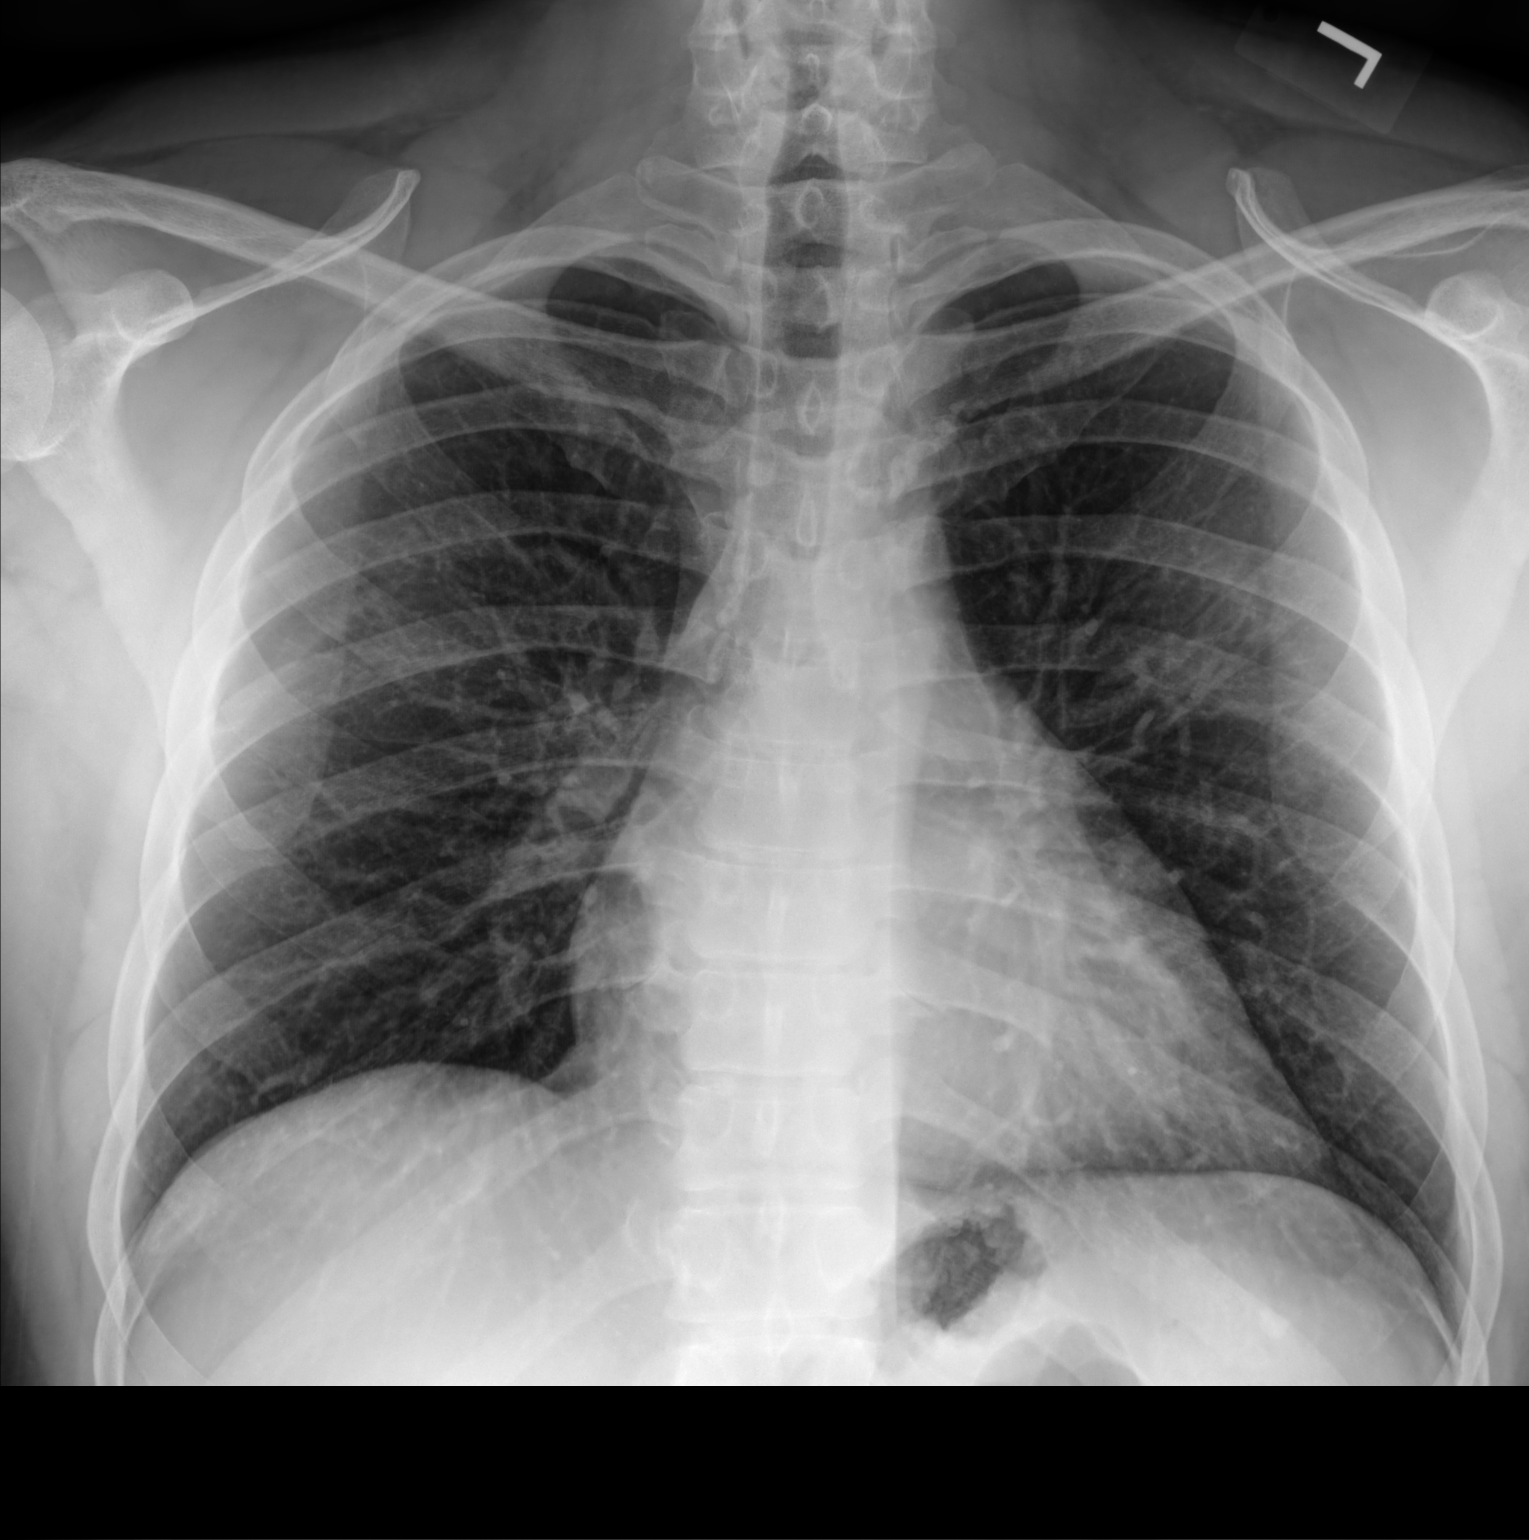

[chest lat]
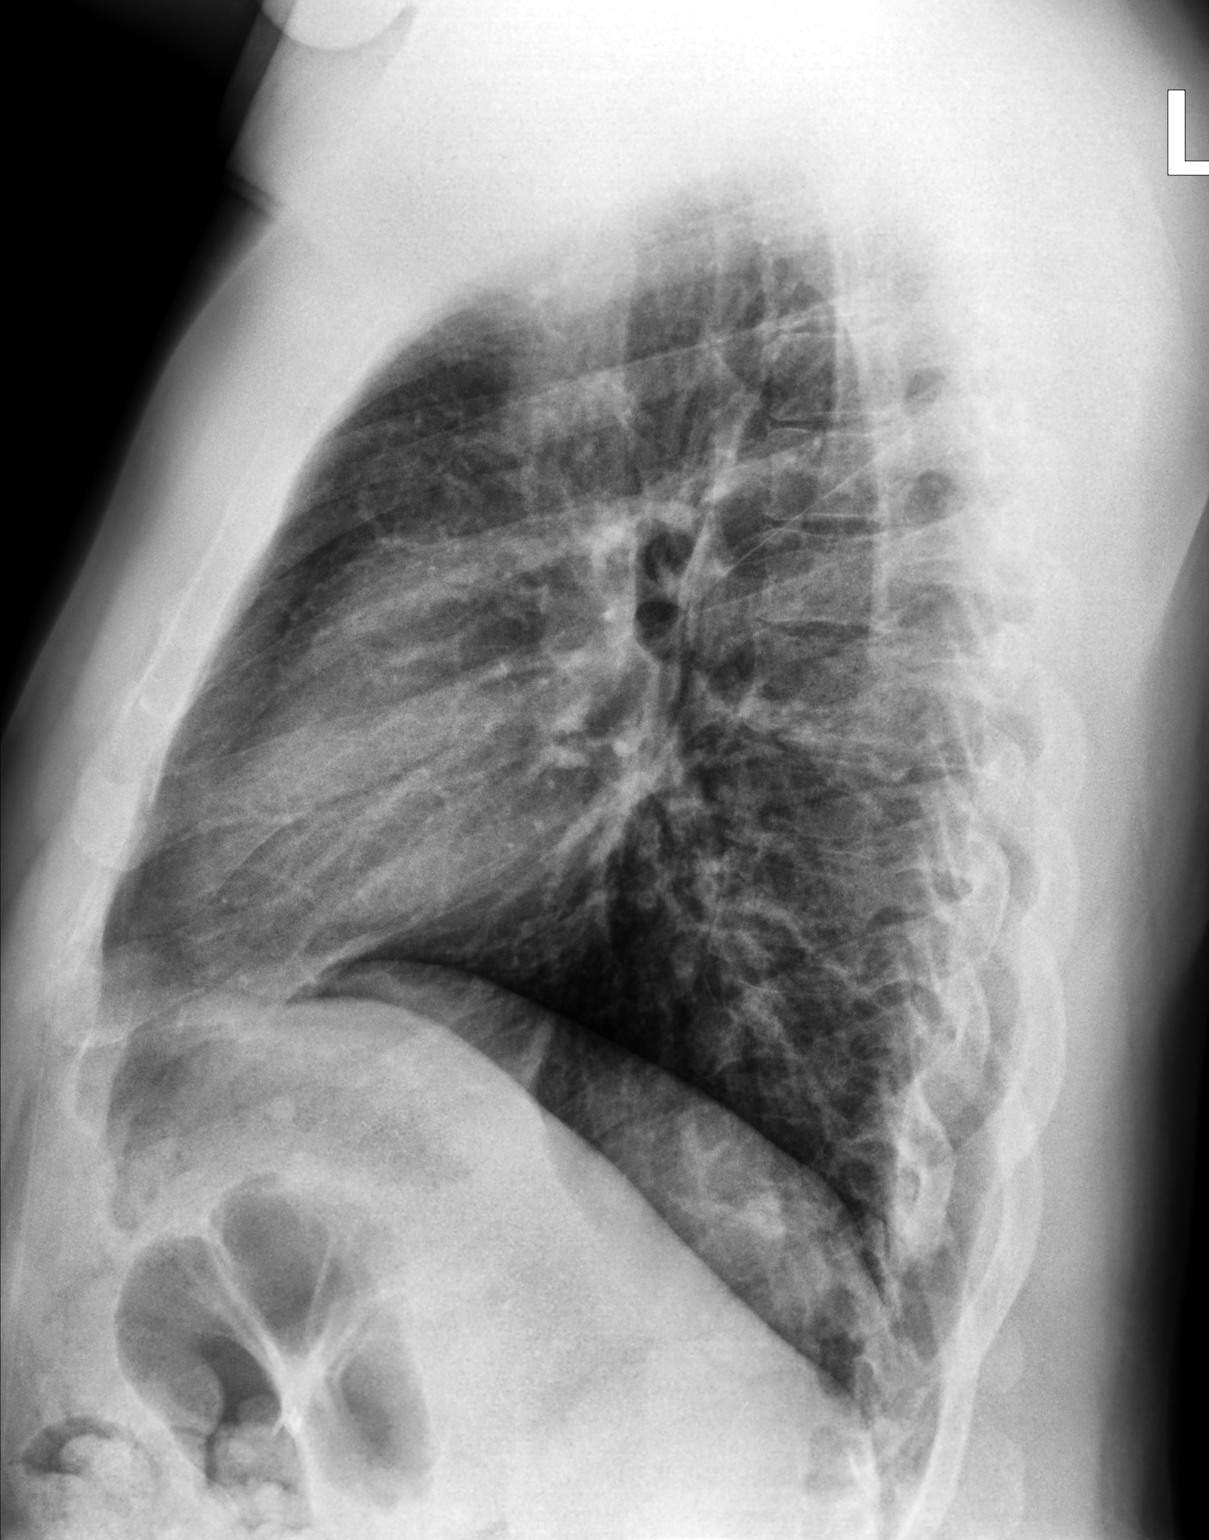

[2 of 2 positions shown; findings below may reference images not displayed]

FINDINGS: The heart size and mediastinal contours are within normal limits.
Both lungs are clear. The visualized skeletal structures are
unremarkable.
IMPRESSION: No active cardiopulmonary disease.

## 2023-07-29 DIAGNOSIS — H1032 Unspecified acute conjunctivitis, left eye: Secondary | ICD-10-CM | POA: Diagnosis not present

## 2023-12-28 DIAGNOSIS — R002 Palpitations: Secondary | ICD-10-CM | POA: Diagnosis not present

## 2023-12-28 DIAGNOSIS — X58XXXA Exposure to other specified factors, initial encounter: Secondary | ICD-10-CM | POA: Diagnosis not present

## 2023-12-28 DIAGNOSIS — T887XXA Unspecified adverse effect of drug or medicament, initial encounter: Secondary | ICD-10-CM | POA: Diagnosis not present

## 2023-12-28 DIAGNOSIS — K219 Gastro-esophageal reflux disease without esophagitis: Secondary | ICD-10-CM | POA: Diagnosis not present

## 2024-02-26 ENCOUNTER — Other Ambulatory Visit: Payer: Self-pay

## 2024-02-26 DIAGNOSIS — R002 Palpitations: Secondary | ICD-10-CM | POA: Diagnosis not present

## 2024-02-26 DIAGNOSIS — K219 Gastro-esophageal reflux disease without esophagitis: Secondary | ICD-10-CM

## 2024-03-17 DIAGNOSIS — R300 Dysuria: Secondary | ICD-10-CM | POA: Diagnosis not present

## 2024-03-21 ENCOUNTER — Ambulatory Visit: Attending: Cardiology

## 2024-03-21 ENCOUNTER — Ambulatory Visit: Attending: Cardiology | Admitting: Cardiology

## 2024-03-21 ENCOUNTER — Other Ambulatory Visit (HOSPITAL_COMMUNITY): Payer: Self-pay

## 2024-03-21 ENCOUNTER — Encounter: Payer: Self-pay | Admitting: Cardiology

## 2024-03-21 VITALS — BP 112/86 | HR 90 | Ht 72.0 in | Wt 242.2 lb

## 2024-03-21 DIAGNOSIS — I499 Cardiac arrhythmia, unspecified: Secondary | ICD-10-CM | POA: Insufficient documentation

## 2024-03-21 DIAGNOSIS — E782 Mixed hyperlipidemia: Secondary | ICD-10-CM

## 2024-03-21 DIAGNOSIS — I493 Ventricular premature depolarization: Secondary | ICD-10-CM

## 2024-03-21 DIAGNOSIS — R9431 Abnormal electrocardiogram [ECG] [EKG]: Secondary | ICD-10-CM | POA: Insufficient documentation

## 2024-03-21 DIAGNOSIS — R072 Precordial pain: Secondary | ICD-10-CM | POA: Diagnosis not present

## 2024-03-21 MED ORDER — METOPROLOL TARTRATE 100 MG PO TABS
ORAL_TABLET | ORAL | 0 refills | Status: AC
  Filled 2024-03-21: qty 1, 1d supply, fill #0

## 2024-03-21 MED ORDER — ROSUVASTATIN CALCIUM 20 MG PO TABS
20.0000 mg | ORAL_TABLET | Freq: Every day | ORAL | 3 refills | Status: AC
  Filled 2024-03-21: qty 30, 30d supply, fill #0

## 2024-03-21 NOTE — Progress Notes (Signed)
 Cardiology Office Note:  .   Date:  03/21/2024  ID:  Charles Huffman, DOB 10/17/1985, MRN [id number] PCP: Pcp, No  [facility] Providers Cardiologist:  Newman Lawrence, MD PCP: Pcp, No  Chief Complaint  Patient presents with   Palpitations     Charles Huffman is a 38 y.o. male with hyperlipidemia, palpitations, chest pain  Discussed the use of AI scribe software for clinical note transcription with the patient, who gave verbal consent to proceed.  History of Present Illness Charles Huffman is a 38 year old male who presents with palpitations and a sensation of a lump in the throat.  He experiences intermittent palpitations, described as his heart 'skipping a beat', first noticed in 2017 or 2018. These episodes vary in frequency and duration, with some days being more symptomatic than others. Physical activity such as walking or doing jumping jacks previously relieved the symptoms, but this is not effective currently. There is no associated shortness of breath, lightheadedness, or leg swelling during these episodes.  He describes a sensation of a 'bubble' or lump in his throat, typically occurring when sitting or resting. He occasionally experiences sharp chest pain on the left side, lasting only a few seconds, usually when sitting. There is no chest pain or pressure during physical activity.  He has high cholesterol, noted in 2023, but is not currently on any medication for it. He was previously prescribed Crestor  5 mg but has not been taking it. He denies any family history of heart disease and maintains a healthy lifestyle, including regular exercise and a diet low in red meat and saturated fats. He denies smoking or alcohol use.      Vitals:   03/21/24 1015  BP: 112/86  Pulse: 90  SpO2: 96%      Review of Systems  Cardiovascular:  Positive for chest pain and palpitations. Negative for dyspnea on exertion, leg swelling and syncope.        Studies Reviewed: SABRA         EKG 03/21/2024: Sinus rhythm with occasional Premature ventricular complexes Left axis deviation Low voltage QRS Cannot rule out Anterior infarct , age undetermined When compared with ECG of 19-May-2017 18:04, Nonspecific T wave abnormality now evident in Lateral leads  Labs 05/2022: Chol 249, TG 82, HDL 39, LDL 195 Hb 14.5 Cr 1.1 TSH 3.8   Physical Exam Vitals and nursing note reviewed.  Constitutional:      General: He is not in acute distress. Neck:     Vascular: No JVD.  Cardiovascular:     Rate and Rhythm: Normal rate and regular rhythm.     Heart sounds: Normal heart sounds. No murmur heard. Pulmonary:     Effort: Pulmonary effort is normal.     Breath sounds: Normal breath sounds. No wheezing or rales.  Musculoskeletal:     Right lower leg: No edema.     Left lower leg: No edema.      VISIT DIAGNOSES:   ICD-10-CM   1. PVC (premature ventricular contraction)  I49.3 EKG 12-Lead    LONG TERM MONITOR (3-14 DAYS)    Basic metabolic panel with GFR    ECHOCARDIOGRAM COMPLETE    2. Mixed hyperlipidemia  E78.2 Lipid panel    3. Precordial pain  R07.2        Charles Huffman is a 38 y.o. male with  hyperlipidemia, palpitations, chest pain  Assessment & Plan Premature ventricular contractions (PVCs): Intermittent sensation of heart skipping likely due to PVCs. EKG  shows one PVC. No alarming symptoms. Symptoms present since 2017-2018, sometimes alleviated by physical activity. Monitor needed to assess PVC frequency. - Provide a monitor to wear for two weeks to assess frequency of PVCs.  Mixed hyperlipidemia: LDL 195 in 05/2022, suspicious for HeFH. Discussed diet and lifestyle modifications.   Start Crestor  20 mg daily. Check lipid panel in 04/2024.  Precordial pain: Vague, nonspecific chest pains, sometimes sharp, typically while sitting. No clear angina pain with physical activity. Further evaluation needed due to high cholesterol and potential  coronary artery disease risk. - Recommend echocardiogram coronary CTA     Meds ordered this encounter  Medications   rosuvastatin  (CRESTOR ) 20 MG tablet    Sig: Take 1 tablet (20 mg total) by mouth daily.    Dispense:  30 tablet    Refill:  3   metoprolol  tartrate (LOPRESSOR ) 100 MG tablet    Sig: TAKE 1 TABLET BY MOUTH 2 HOURS PRIOR TO THE CARDIAC CT    Dispense:  1 tablet    Refill:  0     F/u in 3 months  Signed, Newman JINNY Lawrence, MD

## 2024-03-21 NOTE — Patient Instructions (Addendum)
 Medication Instructions:  START Crestor  20 mg daily  *If you need a refill on your cardiac medications before your next appointment, please call your pharmacy*  Lab Work: BMP TODAY  LIPID PANEL 04/2024  If you have labs (blood work) drawn today and your tests are completely normal, you will receive your results only by: MyChart Message (if you have MyChart) OR A paper copy in the mail If you have any lab test that is abnormal or we need to change your treatment, we will call you to review the results.  Testing/Procedures: Your physician has requested that you have cardiac CT. Cardiac computed tomography (CT) is a painless test that uses an x-ray machine to take clear, detailed pictures of your heart. For further information please visit [email or web address] Please follow instruction sheet BELOW:    Your cardiac CT will be scheduled at one of the below locations:   [facility] [address] [place], [place] [address] [phone]  OR   [facility] [address] [place], [place] [address] [phone]  OR   [facility] [address] [place], [place] [address] [phone]  OR   [facility] [address]  [place], [place] [address]  OR   [facility] [address] [place], [place]  If scheduled at [facility], please arrive at the [facility] Entrance (Entrance C2) of [facility] 30 minutes prior to test start time. You can use the FREE valet parking offered at entrance C (encouraged to control the heart rate for the test)  Proceed to the [facility] Radiology Department (first floor) to check-in and test prep.  All radiology patients and guests should use entrance C2 at [facility], accessed from [facility], even though the hospital's physical address listed is [address]  If scheduled at the Heart and Vascular  Tower at [address], please enter the parking lot using the [address] entrance and use the FREE valet service at the patient drop-off area. Enter the building and check-in with registration on the main floor.  If scheduled at [facility], please arrive to the Heart and Vascular Center 15 mins early for check-in and test prep.  There is spacious parking and easy access to the radiology department from the [facility] entrance. Please enter here and check-in with the desk attendant.   If scheduled at [facility], please arrive 30 minutes early for check-in and test prep.  Please follow these instructions carefully (unless otherwise directed):  An IV will be required for this test and Nitroglycerin will be given.  Hold all erectile dysfunction medications at least 3 days (72 hrs) prior to test. (Ie viagra, cialis, sildenafil, tadalafil, etc)   On the Night Before the Test: Be sure to Drink plenty of water. Do not consume any caffeinated/decaffeinated beverages or chocolate 12 hours prior to your test. Do not take any antihistamines 12 hours prior to your test.  On the Day of the Test: Drink plenty of water until 1 hour prior to the test. Do not eat any food 1 hour prior to test. You may take your regular medications prior to the test.  Take metoprolol  (Lopressor ) two hours prior to test.   *For Clinical Staff only. Please instruct patient the following:* Heart Rate Medication Recommendations for Cardiac CT  Resting HR < 50 bpm  No medication  Resting HR 50-60  bpm and BP >110/50 mmHG   Consider Metoprolol  tartrate 25 mg PO 90-120 min prior to scan  Resting HR 60-65 bpm and BP >110/50 mmHG  Metoprolol  tartrate 50 mg PO 90-120 minutes prior to scan   Resting HR > 65 bpm and BP >110/50 mmHG  Metoprolol  tartrate 100 mg PO 90-120 minutes prior to scan  Consider Ivabradine 10-15 mg PO or a calcium  channel blocker for resting HR >60 bpm and  contraindication to metoprolol  tartrate  Consider Ivabradine 10-15 mg PO in combination with metoprolol  tartrate for HR >80 bpm        After the Test: Drink plenty of water. After receiving IV contrast, you may experience a mild flushed feeling. This is normal. On occasion, you may experience a mild rash up to 24 hours after the test. This is not dangerous. If this occurs, you can take Benadryl 25 mg, Zyrtec, Claritin, or Allegra and increase your fluid intake. (Patients taking Tikosyn should avoid Benadryl, and may take Zyrtec, Claritin, or Allegra) If you experience trouble breathing, this can be serious. If it is severe call 911 IMMEDIATELY. If it is mild, please call our office.  We will call to schedule your test 2-4 weeks out understanding that some insurance companies will need an authorization prior to the service being performed.   For more information and frequently asked questions, please visit our website : [email or web address]  For non-scheduling related questions, please contact the cardiac imaging nurse navigator should you have any questions/concerns: Cardiac Imaging Nurse Navigators Direct Office Dial: [phone]   For scheduling needs, including cancellations and rescheduling, please call [place], [phone]. G   2 WEEK ZIO   Your physician has requested that you wear a Zio heart monitor for __14___ days. This will be mailed to your home with instructions on how to apply the monitor and how to return it when finished. Please allow 2 weeks after returning the heart monitor before our office calls you with the results.   ECHO  Your physician has requested that you have an echocardiogram. Echocardiography is a painless test that uses sound waves to create images of your heart. It provides your doctor with information about the size and shape of your heart and how well your heart's chambers and valves are working. This procedure takes approximately one hour.  There are no restrictions for this procedure. Please do NOT wear cologne, perfume, aftershave, or lotions (deodorant is allowed). Please arrive 15 minutes prior to your appointment time.  Please note: We ask at that you not bring children with you during ultrasound (echo/ vascular) testing. Due to room size and safety concerns, children are not allowed in the ultrasound rooms during exams. Our front office staff cannot provide observation of children in our lobby area while testing is being conducted. An adult accompanying a patient to their appointment will only be allowed in the ultrasound room at the discretion of the ultrasound technician under special circumstances. We apologize for any inconvenience.   Follow-Up: At [facility], you and your health needs are our priority.  As part of our continuing mission to provide you with exceptional heart care, our providers are all part of one team.  This team includes your primary Cardiologist (physician) and Advanced Practice Providers or APPs (Physician Assistants and Nurse Practitioners) who all work together to provide you with the care you need, when you need it.  Your next appointment:   3 month(s)  Provider:   Newman JINNY Lawrence, MD

## 2024-03-21 NOTE — Addendum Note (Signed)
 Addended by: MANDA BOTTCHER B on: 03/21/2024 01:25 PM   Modules accepted: Orders

## 2024-03-22 LAB — BASIC METABOLIC PANEL WITH GFR
BUN/Creatinine Ratio: 10 (ref 9–20)
BUN: 13 mg/dL (ref 6–20)
CO2: 22 mmol/L (ref 20–29)
Calcium: 9.8 mg/dL (ref 8.7–10.2)
Chloride: 101 mmol/L (ref 96–106)
Creatinine, Ser: 1.29 mg/dL — ABNORMAL HIGH (ref 0.76–1.27)
Glucose: 87 mg/dL (ref 70–99)
Potassium: 3.9 mmol/L (ref 3.5–5.2)
Sodium: 139 mmol/L (ref 134–144)
eGFR: 72 mL/min/1.73 (ref >59)

## 2024-03-27 ENCOUNTER — Ambulatory Visit: Payer: Self-pay | Admitting: Cardiology

## 2024-03-27 ENCOUNTER — Ambulatory Visit
Admission: RE | Admit: 2024-03-27 | Discharge: 2024-03-27 | Disposition: A | Discharge status: Home / Self Care | Source: Ambulatory Visit

## 2024-03-27 DIAGNOSIS — K219 Gastro-esophageal reflux disease without esophagitis: Secondary | ICD-10-CM | POA: Diagnosis not present

## 2024-04-16 DIAGNOSIS — I493 Ventricular premature depolarization: Secondary | ICD-10-CM | POA: Diagnosis not present

## 2024-04-17 DIAGNOSIS — I493 Ventricular premature depolarization: Secondary | ICD-10-CM

## 2024-04-21 ENCOUNTER — Encounter (HOSPITAL_COMMUNITY): Payer: Self-pay

## 2024-04-22 ENCOUNTER — Ambulatory Visit: Admitting: Cardiology

## 2024-04-22 ENCOUNTER — Ambulatory Visit: Admitting: Cardiovascular Disease

## 2024-04-23 ENCOUNTER — Ambulatory Visit (HOSPITAL_COMMUNITY)
Admission: RE | Admit: 2024-04-23 | Discharge: 2024-04-23 | Disposition: A | Discharge status: Home / Self Care | Source: Ambulatory Visit | Attending: Cardiovascular Disease | Admitting: Cardiovascular Disease

## 2024-04-23 DIAGNOSIS — I493 Ventricular premature depolarization: Secondary | ICD-10-CM | POA: Insufficient documentation

## 2024-04-23 DIAGNOSIS — R072 Precordial pain: Secondary | ICD-10-CM

## 2024-04-23 LAB — ECHOCARDIOGRAM COMPLETE: S' Lateral: 3.16 cm

## 2024-04-23 MED ORDER — NITROGLYCERIN 0.4 MG SL SUBL
0.8000 mg | SUBLINGUAL_TABLET | Freq: Once | SUBLINGUAL | Status: AC
  Administered 2024-04-23: 0.8 mg via SUBLINGUAL

## 2024-04-23 MED ORDER — IOHEXOL 350 MG/ML SOLN
100.0000 mL | Freq: Once | INTRAVENOUS | Status: AC | PRN
  Administered 2024-04-23: 100 mL via INTRAVENOUS

## 2024-04-23 MED ORDER — PERFLUTREN LIPID MICROSPHERE
1.0000 mL | INTRAVENOUS | Status: AC | PRN
  Administered 2024-04-23: 2 mL via INTRAVENOUS

## 2024-06-03 ENCOUNTER — Other Ambulatory Visit (HOSPITAL_COMMUNITY): Payer: Self-pay

## 2024-06-03 DIAGNOSIS — R002 Palpitations: Secondary | ICD-10-CM | POA: Diagnosis not present

## 2024-06-03 DIAGNOSIS — K219 Gastro-esophageal reflux disease without esophagitis: Secondary | ICD-10-CM | POA: Diagnosis not present

## 2024-06-03 DIAGNOSIS — R0981 Nasal congestion: Secondary | ICD-10-CM | POA: Diagnosis not present

## 2024-06-03 DIAGNOSIS — H6991 Unspecified Eustachian tube disorder, right ear: Secondary | ICD-10-CM | POA: Diagnosis not present

## 2024-06-03 MED ORDER — AZELASTINE HCL 0.1 % NA SOLN
1.0000 | Freq: Two times a day (BID) | NASAL | 0 refills | Status: AC
  Filled 2024-06-03: qty 30, 30d supply, fill #0

## 2024-06-11 ENCOUNTER — Ambulatory Visit: Admitting: Cardiology

## 2024-06-13 ENCOUNTER — Other Ambulatory Visit (HOSPITAL_COMMUNITY): Payer: Self-pay

## 2024-07-11 DIAGNOSIS — H9201 Otalgia, right ear: Secondary | ICD-10-CM | POA: Diagnosis not present

## 2024-07-11 DIAGNOSIS — H6991 Unspecified Eustachian tube disorder, right ear: Secondary | ICD-10-CM | POA: Diagnosis not present
